# Patient Record
Sex: Female | Born: 1985 | Race: White | Hispanic: No | Marital: Married | Smoking: Never smoker
Health system: Southern US, Community
[De-identification: ages and names within clinical notes are randomized; demographics above are authoritative.]

## PROBLEM LIST (undated history)

## (undated) ENCOUNTER — Inpatient Hospital Stay (HOSPITAL_COMMUNITY): Payer: Self-pay

## (undated) DIAGNOSIS — W3400XA Accidental discharge from unspecified firearms or gun, initial encounter: Secondary | ICD-10-CM

---
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
## 2013-06-15 ENCOUNTER — Other Ambulatory Visit (HOSPITAL_COMMUNITY): Payer: Self-pay | Admitting: Orthopedic Surgery

## 2013-06-15 DIAGNOSIS — M47817 Spondylosis without myelopathy or radiculopathy, lumbosacral region: Secondary | ICD-10-CM

## 2013-06-22 ENCOUNTER — Ambulatory Visit (HOSPITAL_COMMUNITY)
Admission: RE | Admit: 2013-06-22 | Discharge: 2013-06-22 | Disposition: A | Discharge status: Home / Self Care | Payer: 59 | Source: Ambulatory Visit | Attending: Orthopedic Surgery | Admitting: Orthopedic Surgery

## 2013-06-22 DIAGNOSIS — M545 Low back pain, unspecified: Secondary | ICD-10-CM | POA: Insufficient documentation

## 2013-06-22 DIAGNOSIS — M79609 Pain in unspecified limb: Secondary | ICD-10-CM | POA: Insufficient documentation

## 2013-06-22 DIAGNOSIS — M47817 Spondylosis without myelopathy or radiculopathy, lumbosacral region: Secondary | ICD-10-CM | POA: Insufficient documentation

## 2014-08-22 ENCOUNTER — Other Ambulatory Visit (HOSPITAL_COMMUNITY): Payer: Self-pay | Admitting: *Deleted

## 2014-08-22 DIAGNOSIS — Z3490 Encounter for supervision of normal pregnancy, unspecified, unspecified trimester: Secondary | ICD-10-CM

## 2014-08-23 ENCOUNTER — Ambulatory Visit (HOSPITAL_COMMUNITY)
Admission: RE | Admit: 2014-08-23 | Discharge: 2014-08-23 | Disposition: A | Discharge status: Home / Self Care | Payer: 59 | Source: Ambulatory Visit | Attending: Nurse Practitioner | Admitting: Nurse Practitioner

## 2014-08-23 DIAGNOSIS — Z3A09 9 weeks gestation of pregnancy: Secondary | ICD-10-CM | POA: Insufficient documentation

## 2014-08-23 DIAGNOSIS — Z36 Encounter for antenatal screening of mother: Secondary | ICD-10-CM | POA: Insufficient documentation

## 2014-08-23 DIAGNOSIS — Z3490 Encounter for supervision of normal pregnancy, unspecified, unspecified trimester: Secondary | ICD-10-CM

## 2014-09-28 ENCOUNTER — Emergency Department (HOSPITAL_COMMUNITY)
Admission: EM | Admit: 2014-09-28 | Discharge: 2014-09-28 | Disposition: A | Discharge status: Home / Self Care | Payer: 59 | Attending: Emergency Medicine | Admitting: Emergency Medicine

## 2014-09-28 ENCOUNTER — Encounter (HOSPITAL_COMMUNITY): Payer: Self-pay | Admitting: *Deleted

## 2014-09-28 ENCOUNTER — Inpatient Hospital Stay (HOSPITAL_COMMUNITY)
Admission: AD | Admit: 2014-09-28 | Discharge: 2014-09-28 | Disposition: A | Discharge status: Home / Self Care | Payer: 59 | Source: Ambulatory Visit | Attending: Obstetrics & Gynecology | Admitting: Obstetrics & Gynecology

## 2014-09-28 DIAGNOSIS — Z3A13 13 weeks gestation of pregnancy: Secondary | ICD-10-CM

## 2014-09-28 DIAGNOSIS — J069 Acute upper respiratory infection, unspecified: Secondary | ICD-10-CM

## 2014-09-28 DIAGNOSIS — O9989 Other specified diseases and conditions complicating pregnancy, childbirth and the puerperium: Secondary | ICD-10-CM

## 2014-09-28 DIAGNOSIS — O99511 Diseases of the respiratory system complicating pregnancy, first trimester: Secondary | ICD-10-CM | POA: Diagnosis not present

## 2014-09-28 DIAGNOSIS — Z87828 Personal history of other (healed) physical injury and trauma: Secondary | ICD-10-CM | POA: Insufficient documentation

## 2014-09-28 DIAGNOSIS — Z3A14 14 weeks gestation of pregnancy: Secondary | ICD-10-CM | POA: Insufficient documentation

## 2014-09-28 HISTORY — DX: Accidental discharge from unspecified firearms or gun, initial encounter: W34.00XA

## 2014-09-28 LAB — POC URINE PREG, ED: Preg Test, Ur: POSITIVE — AB

## 2014-09-28 LAB — RAPID STREP SCREEN (MED CTR MEBANE ONLY): Streptococcus, Group A Screen (Direct): NEGATIVE

## 2014-09-28 MED ORDER — ACETAMINOPHEN 325 MG PO TABS
650.0000 mg | ORAL_TABLET | Freq: Once | ORAL | Status: AC
  Administered 2014-09-28: 650 mg via ORAL
  Filled 2014-09-28: qty 2

## 2014-09-28 MED ORDER — LORATADINE 10 MG PO TABS
10.0000 mg | ORAL_TABLET | Freq: Once | ORAL | Status: AC
  Administered 2014-09-28: 10 mg via ORAL
  Filled 2014-09-28: qty 1

## 2014-09-28 MED ORDER — LORATADINE 10 MG PO TABS: 10.0000 mg | ORAL_TABLET | Freq: Every day | ORAL | Status: AC

## 2014-09-28 NOTE — ED Notes (Signed)
Dr. Yelverton at the bedside.  

## 2014-09-28 NOTE — ED Notes (Signed)
Gave pt water, per RN 

## 2014-09-28 NOTE — ED Notes (Signed)
Pt arrives to ED c/o cough and shortness of breath. Non-productive cough. States she has taken robitussin every 4 hours with no relief. States she is [redacted] weeks pregnant.
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
## 2014-09-28 NOTE — Discharge Instructions (Signed)
Upper Respiratory Infection, Adult An upper respiratory infection (URI) is also sometimes known as the common cold. The upper respiratory tract includes the nose, sinuses, throat, trachea, and bronchi. Bronchi are the airways leading to the lungs. Most people improve within 1 week, but symptoms can last up to 2 weeks. A residual cough may last even longer.  CAUSES Many different viruses can infect the tissues lining the upper respiratory tract. The tissues become irritated and inflamed and often become very moist. Mucus production is also common. A cold is contagious. You can easily spread the virus to others by oral contact. This includes kissing, sharing a glass, coughing, or sneezing. Touching your mouth or nose and then touching a surface, which is then touched by another person, can also spread the virus. SYMPTOMS  Symptoms typically develop 1 to 3 days after you come in contact with a cold virus. Symptoms vary from person to person. They may include:  Runny nose.  Sneezing.  Nasal congestion.  Sinus irritation.  Sore throat.  Loss of voice (laryngitis).  Cough.  Fatigue.  Muscle aches.  Loss of appetite.  Headache.  Low-grade fever. DIAGNOSIS  You might diagnose your own cold based on familiar symptoms, since most people get a cold 2 to 3 times a year. Your caregiver can confirm this based on your exam. Most importantly, your caregiver can check that your symptoms are not due to another disease such as strep throat, sinusitis, pneumonia, asthma, or epiglottitis. Blood tests, throat tests, and X-rays are not necessary to diagnose a common cold, but they may sometimes be helpful in excluding other more serious diseases. Your caregiver will decide if any further tests are required. RISKS AND COMPLICATIONS  You may be at risk for a more severe case of the common cold if you smoke cigarettes, have chronic heart disease (such as heart failure) or lung disease (such as asthma), or if  you have a weakened immune system. The very young and very old are also at risk for more serious infections. Bacterial sinusitis, middle ear infections, and bacterial pneumonia can complicate the common cold. The common cold can worsen asthma and chronic obstructive pulmonary disease (COPD). Sometimes, these complications can require emergency medical care and may be life-threatening. PREVENTION  The best way to protect against getting a cold is to practice good hygiene. Avoid oral or hand contact with people with cold symptoms. Wash your hands often if contact occurs. There is no clear evidence that vitamin C, vitamin E, echinacea, or exercise reduces the chance of developing a cold. However, it is always recommended to get plenty of rest and practice good nutrition. TREATMENT  Treatment is directed at relieving symptoms. There is no cure. Antibiotics are not effective, because the infection is caused by a virus, not by bacteria. Treatment may include:  Increased fluid intake. Sports drinks offer valuable electrolytes, sugars, and fluids.  Breathing heated mist or steam (vaporizer or shower).  Eating chicken soup or other clear broths, and maintaining good nutrition.  Getting plenty of rest.  Using gargles or lozenges for comfort.  Controlling fevers with ibuprofen or acetaminophen as directed by your caregiver.  Increasing usage of your inhaler if you have asthma. Zinc gel and zinc lozenges, taken in the first 24 hours of the common cold, can shorten the duration and lessen the severity of symptoms. Pain medicines may help with fever, muscle aches, and throat pain. A variety of non-prescription medicines are available to treat congestion and runny nose. Your caregiver   can make recommendations and may suggest nasal or lung inhalers for other symptoms.  HOME CARE INSTRUCTIONS   Only take over-the-counter or prescription medicines for pain, discomfort, or fever as directed by your  caregiver.  Use a warm mist humidifier or inhale steam from a shower to increase air moisture. This may keep secretions moist and make it easier to breathe.  Drink enough water and fluids to keep your urine clear or pale yellow.  Rest as needed.  Return to work when your temperature has returned to normal or as your caregiver advises. You may need to stay home longer to avoid infecting others. You can also use a face mask and careful hand washing to prevent spread of the virus. SEEK MEDICAL CARE IF:   After the first few days, you feel you are getting worse rather than better.  You need your caregiver's advice about medicines to control symptoms.  You develop chills, worsening shortness of breath, or brown or red sputum. These may be signs of pneumonia.  You develop yellow or brown nasal discharge or pain in the face, especially when you bend forward. These may be signs of sinusitis.  You develop a fever, swollen neck glands, pain with swallowing, or white areas in the back of your throat. These may be signs of strep throat. SEEK IMMEDIATE MEDICAL CARE IF:   You have a fever.  You develop severe or persistent headache, ear pain, sinus pain, or chest pain.  You develop wheezing, a prolonged cough, cough up blood, or have a change in your usual mucus (if you have chronic lung disease).  You develop sore muscles or a stiff neck. Document Released: 01/07/2001 Document Revised: 10/06/2011 Document Reviewed: 10/19/2013 Exit[employer] Patient Information 2015 [employer]. This information is not intended to replace advice given to you by your health care provider. Make sure you discuss any questions you have with your health care provider.  

## 2014-09-28 NOTE — MAU Note (Signed)
[facility] urgent care this morning . Dx with the flu. FHR this morning was 120. Last HR was 160. She called her OB and was told to come and get FHR rechecked. No other complaints.

## 2014-10-01 LAB — CULTURE, GROUP A STREP: Strep A Culture: NEGATIVE

## 2015-08-03 ENCOUNTER — Encounter (HOSPITAL_COMMUNITY): Payer: Self-pay | Admitting: *Deleted

## 2016-02-08 IMAGING — US US OB COMP LESS 14 WK
1 series · 14 of 28 positions shown · non-contrast
Comparison: None.

CLINICAL DATA: Pregnancy with uncertain dates.  Unsure of LMP.

EXAM:
OBSTETRIC <14 WK ULTRASOUND
TECHNIQUE: Transabdominal ultrasound was performed for evaluation of the
gestation as well as the maternal uterus and adnexal regions.

[Series 1: us ob comp less 14 wk · 0.18mm/px · 14 of 37 slices shown]
[im 2/37]
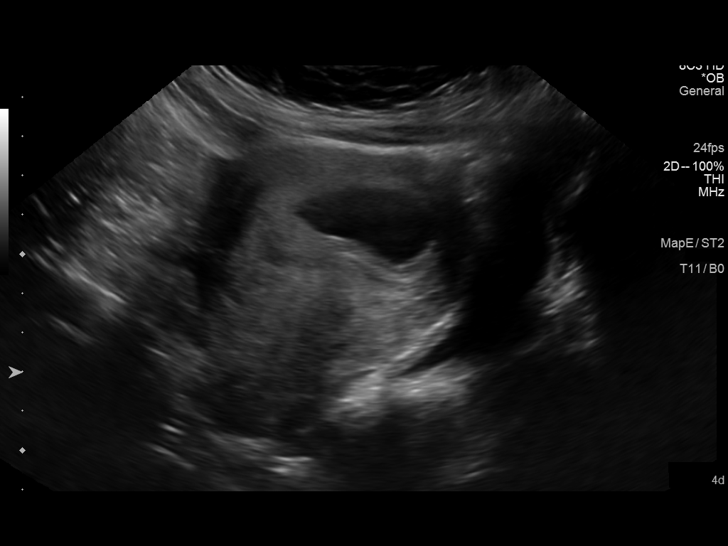
[im 5/37]
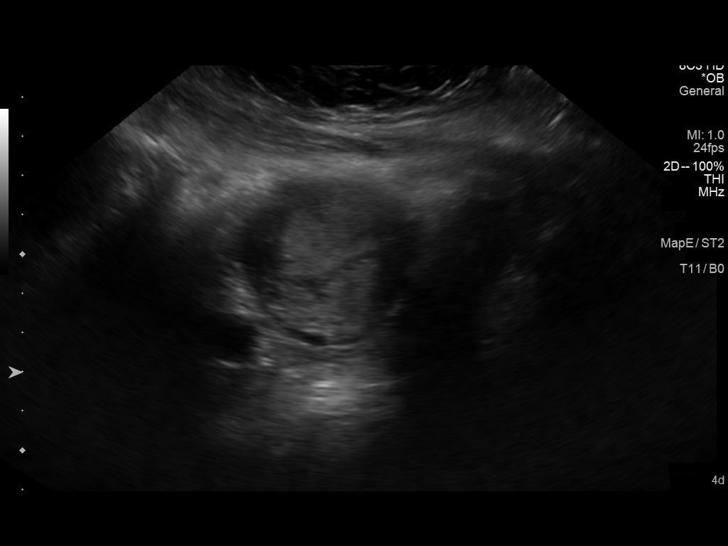
[im 7/37]
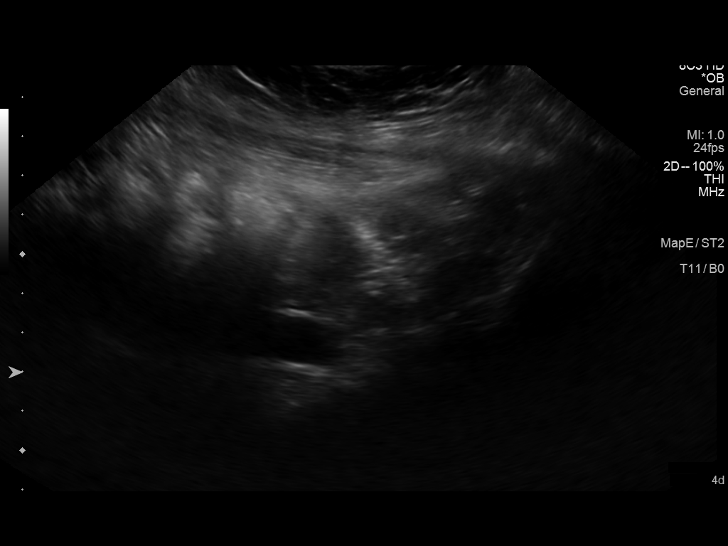
[im 10/37]
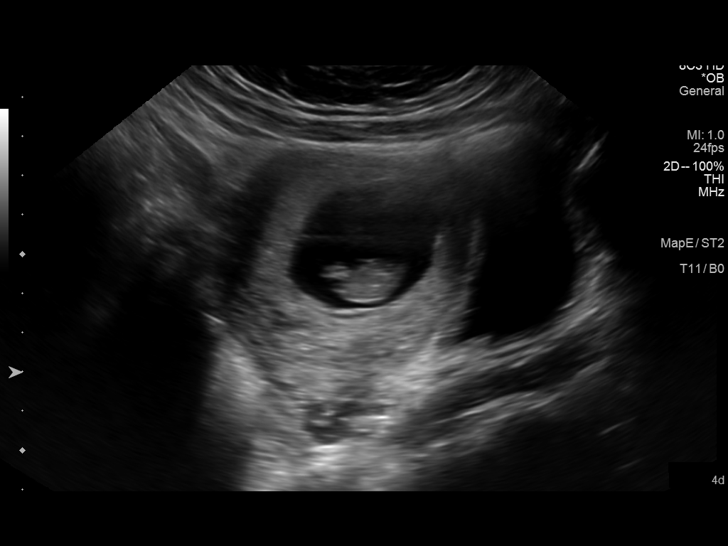
[im 13/37]
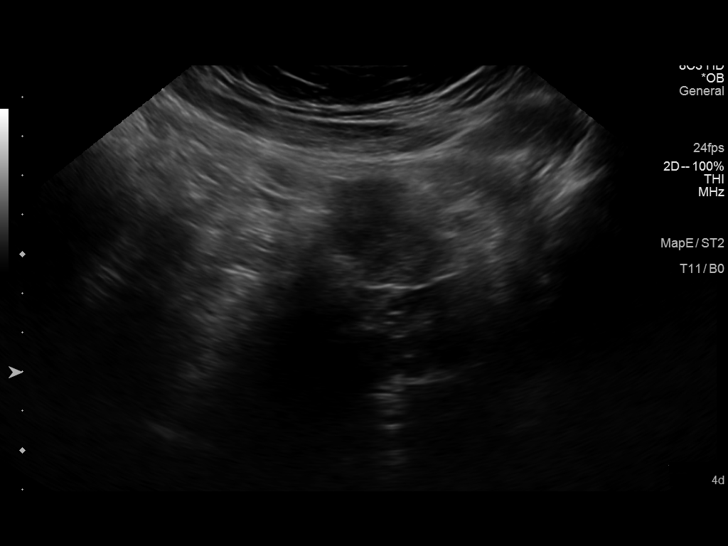
[im 15/37]
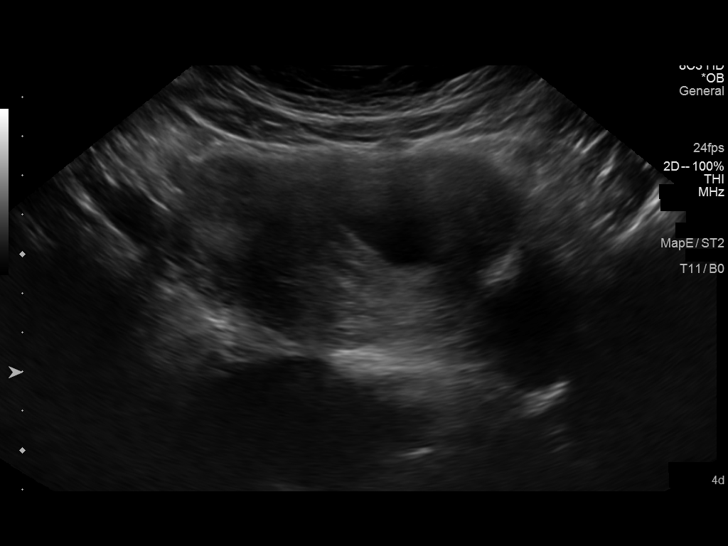
[im 18/37]
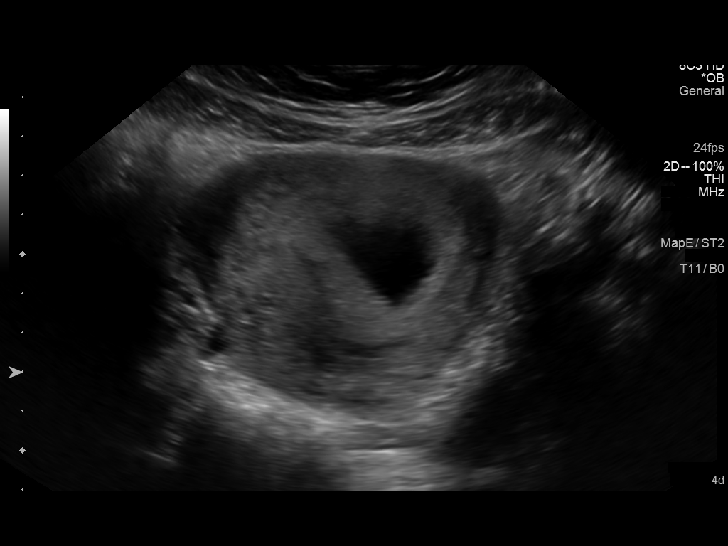
[im 21/37]
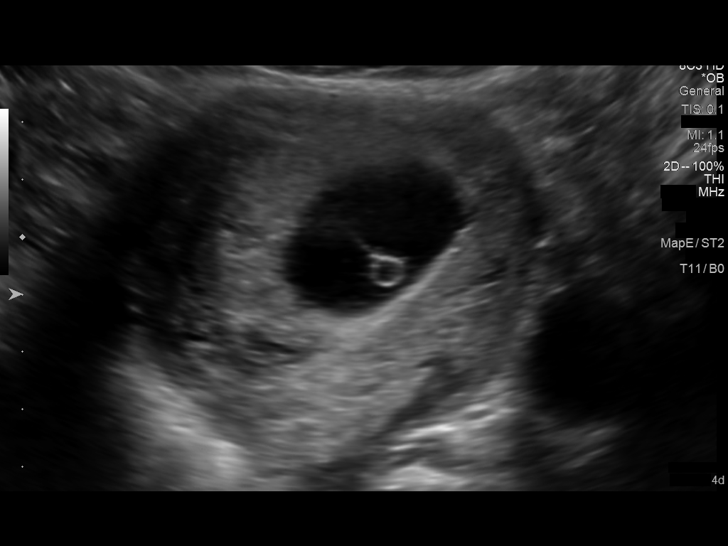
[im 23/37]
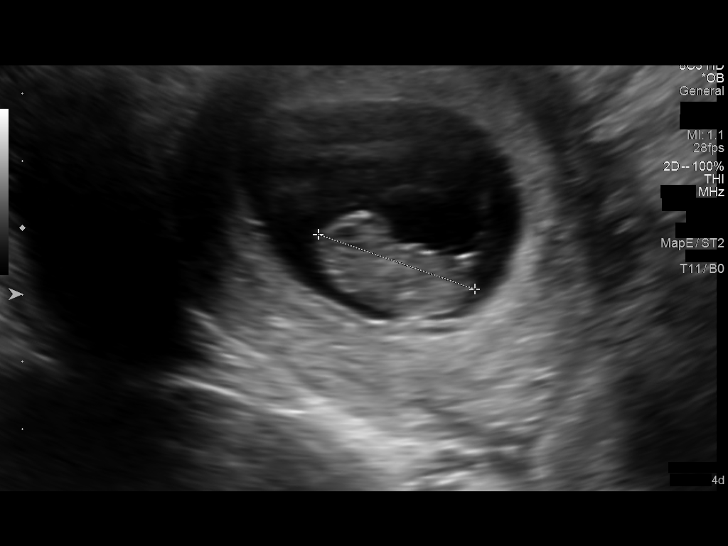
[im 26/37]
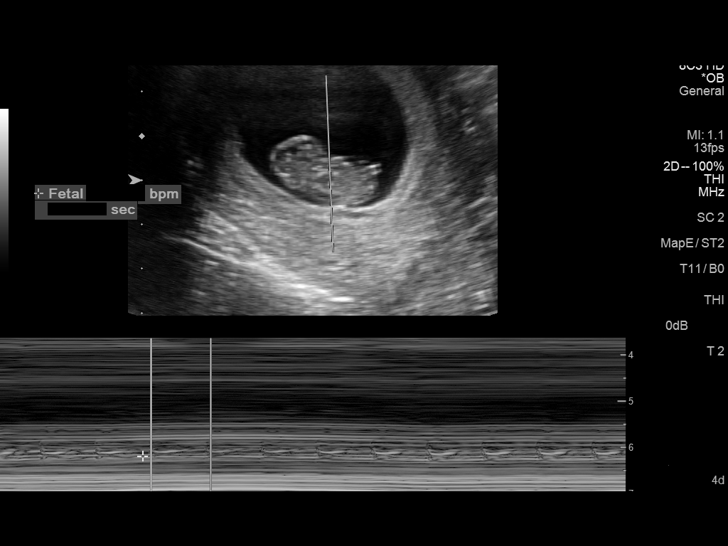
[im 29/37]
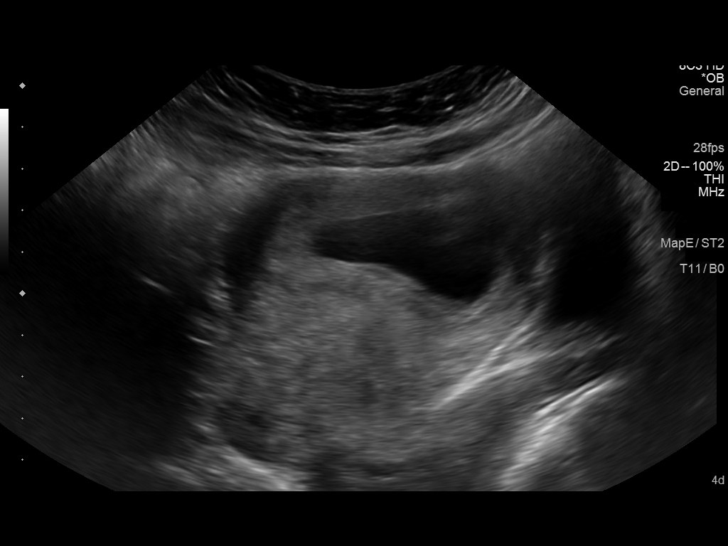
[im 31/37]
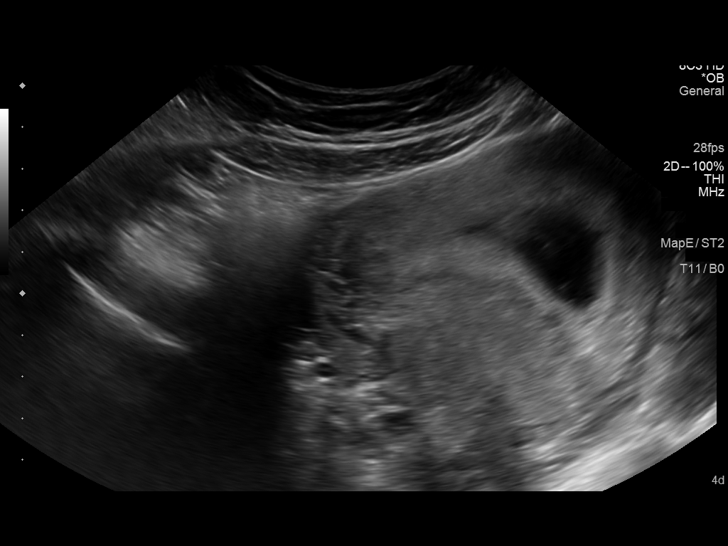
[im 34/37]
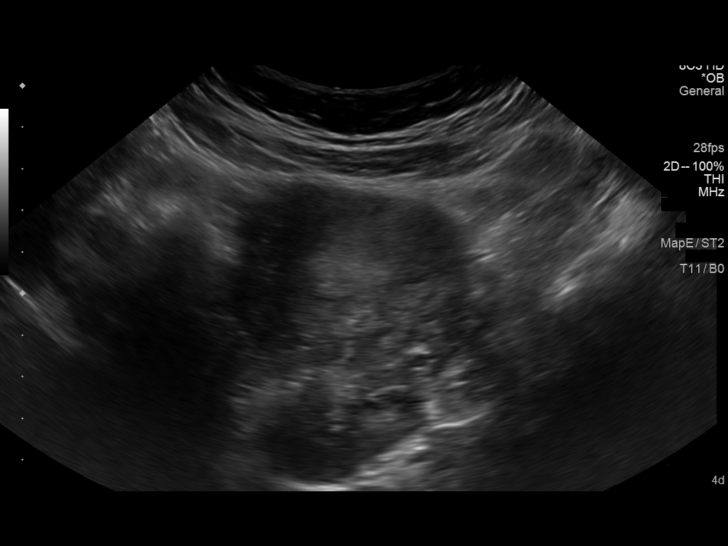
[im 37/37]
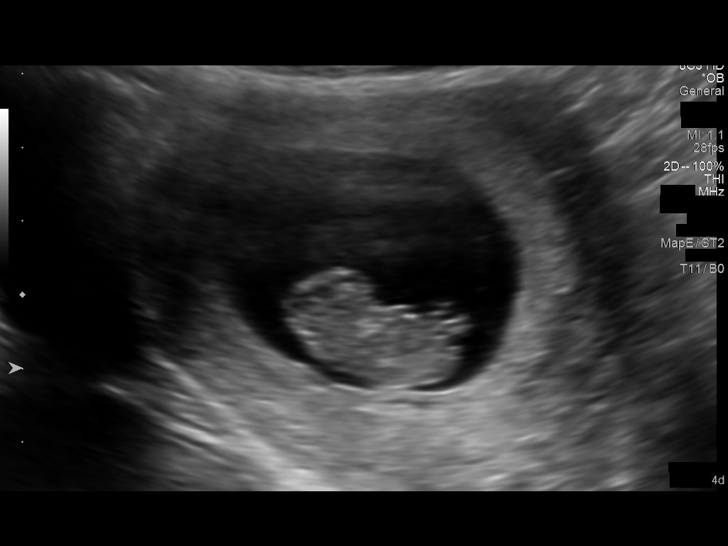

[14 of 28 positions shown; findings below may reference images not displayed]

FINDINGS: Intrauterine gestational sac: Visualized/normal in shape.

Yolk sac:  Visualized

Embryo:  Visualized

Cardiac Activity: Visualized

Heart Rate: 171 bpm

CRL:   25  mm   9 w 2 d                  US EDC: 03/26/2015

Maternal uterus/adnexae: Both ovaries are normal in appearance. No
mass or free fluid identified.
IMPRESSION: Single living IUP measuring 9 weeks 2 days with US EDC of
03/26/2015.

No significant maternal uterine or adnexal abnormality identified.

## 2018-04-12 ENCOUNTER — Ambulatory Visit (INDEPENDENT_AMBULATORY_CARE_PROVIDER_SITE_OTHER): Payer: BLUE CROSS/BLUE SHIELD | Admitting: Allergy and Immunology

## 2018-04-12 ENCOUNTER — Encounter: Payer: Self-pay | Admitting: Allergy and Immunology

## 2018-04-12 VITALS — BP 100/80 | HR 84 | Resp 16 | Ht 65.0 in | Wt 214.0 lb

## 2018-04-12 DIAGNOSIS — H1013 Acute atopic conjunctivitis, bilateral: Secondary | ICD-10-CM | POA: Diagnosis not present

## 2018-04-12 DIAGNOSIS — J3089 Other allergic rhinitis: Secondary | ICD-10-CM | POA: Diagnosis not present

## 2018-04-12 DIAGNOSIS — H1010 Acute atopic conjunctivitis, unspecified eye: Secondary | ICD-10-CM | POA: Insufficient documentation

## 2018-04-12 MED ORDER — LEVOCETIRIZINE DIHYDROCHLORIDE 5 MG PO TABS: 5.0000 mg | ORAL_TABLET | Freq: Every evening | ORAL | 5 refills | Status: AC

## 2018-04-12 MED ORDER — OLOPATADINE HCL 0.7 % OP SOLN: 1.0000 [drp] | OPHTHALMIC | 5 refills | Status: AC

## 2018-04-12 MED ORDER — FLUTICASONE PROPIONATE 93 MCG/ACT NA EXHU: 2.0000 | INHALANT_SUSPENSION | Freq: Two times a day (BID) | NASAL | 3 refills | Status: AC

## 2018-04-12 NOTE — Assessment & Plan Note (Signed)
   Aeroallergen avoidance measures have been discussed and provided in written form.  A prescription has been provided for levocetirizine, 5 mg daily as needed.  A prescription has been provided for Xhance, 2 actuations per nostril twice a day. Proper technique has been discussed and demonstrated.  Nasal saline spray (i.e., Simply Saline) or nasal saline lavage (i.e., NeilMed) is recommended as needed and prior to medicated nasal sprays.  The risks and benefits of aeroallergen immunotherapy have been discussed. The patient is motivated to initiate immunotherapy if insurance coverage is favorable. She will let us know how she would like to proceed. 
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
## 2018-04-12 NOTE — Assessment & Plan Note (Signed)
   Treatment plan as outlined above for allergic rhinitis.  A prescription has been provided for Pazeo, one drop per eye daily as needed.  I have also recommended eye lubricant drops (i.e., Natural Tears) as needed. 

## 2018-04-12 NOTE — Patient Instructions (Addendum)
Seasonal and perennial allergic rhinitis  Aeroallergen avoidance measures have been discussed and provided in written form.  A prescription has been provided for levocetirizine, 5 mg daily as needed.  A prescription has been provided for [facility], 2 actuations per nostril twice a day. Proper technique has been discussed and demonstrated.  Nasal saline spray (i.e., Simply Saline) or nasal saline lavage (i.e., NeilMed) is recommended as needed and prior to medicated nasal sprays.  The risks and benefits of aeroallergen immunotherapy have been discussed. The patient is motivated to initiate immunotherapy if insurance coverage is favorable. She will let us know how she would like to proceed.  Allergic conjunctivitis  Treatment plan as outlined above for allergic rhinitis.  A prescription has been provided for Pazeo, one drop per eye daily as needed.  I have also recommended eye lubricant drops (i.e., Natural Tears) as needed.   Return in about 4 months (around 08/12/2018), or if symptoms worsen or fail to improve.  Reducing Pollen Exposure  The American Academy of Allergy, Asthma and Immunology suggests the following steps to reduce your exposure to pollen during allergy seasons.    1. Do not hang sheets or clothing out to dry; pollen may collect on these items. 2. Do not mow lawns or spend time around freshly cut grass; mowing stirs up pollen. 3. Keep windows closed at night.  Keep car windows closed while driving. 4. Minimize morning activities outdoors, a time when pollen counts are usually at their highest. 5. Stay indoors as much as possible when pollen counts or humidity is high and on windy days when pollen tends to remain in the air longer. 6. Use air conditioning when possible.  Many air conditioners have filters that trap the pollen spores. 7. Use a HEPA room air filter to remove pollen form the indoor air you breathe.   Control of House Dust Mite Allergen  House dust mites  play a major role in allergic asthma and rhinitis.  They occur in environments with high humidity wherever human skin, the food for dust mites is found. High levels have been detected in dust obtained from mattresses, pillows, carpets, upholstered furniture, bed covers, clothes and soft toys.  The principal allergen of the house dust mite is found in its feces.  A gram of dust may contain 1,000 mites and 250,000 fecal particles.  Mite antigen is easily measured in the air during house cleaning activities.    1. Encase mattresses, including the box spring, and pillow, in an air tight cover.  Seal the zipper end of the encased mattresses with wide adhesive tape. 2. Wash the bedding in water of 130 degrees Farenheit weekly.  Avoid cotton comforters/quilts and flannel bedding: the most ideal bed covering is the dacron comforter. 3. Remove all upholstered furniture from the bedroom. 4. Remove carpets, carpet padding, rugs, and non-washable window drapes from the bedroom.  Wash drapes weekly or use plastic window coverings. 5. Remove all non-washable stuffed toys from the bedroom.  Wash stuffed toys weekly. 6. Have the room cleaned frequently with a vacuum cleaner and a damp dust-mop.  The patient should not be in a room which is being cleaned and should wait 1 hour after cleaning before going into the room. 7. Close and seal all heating outlets in the bedroom.  Otherwise, the room will become filled with dust-laden air.  An electric heater can be used to heat the room. 8. Reduce indoor humidity to less than 50%.  Do not use a humidifier.  Control of Dog or Cat Allergen  Avoidance is the best way to manage a dog or cat allergy. If you have a dog or cat and are allergic to dog or cats, consider removing the dog or cat from the home. If you have a dog or cat but don't want to find it a new home, or if your family wants a pet even though someone in the household is allergic, here are some strategies that may  help keep symptoms at bay:  1. Keep the pet out of your bedroom and restrict it to only a few rooms. Be advised that keeping the dog or cat in only one room will not limit the allergens to that room. 2. Don't pet, hug or kiss the dog or cat; if you do, wash your hands with soap and water. 3. High-efficiency particulate air (HEPA) cleaners run continuously in a bedroom or living room can reduce allergen levels over time. 4. Place electrostatic material sheet in the air inlet vent in the bedroom. 5. Regular use of a high-efficiency vacuum cleaner or a central vacuum can reduce allergen levels. 6. Giving your dog or cat a bath at least once a week can reduce airborne allergen.  Control of Cockroach Allergen  Cockroach allergen has been identified as an important cause of acute attacks of asthma, especially in urban settings.  There are fifty-five species of cockroach that exist in the [place], however only three, the [place], [place] and Oriental species produce allergen that can affect patients with Asthma.  Allergens can be obtained from fecal particles, egg casings and secretions from cockroaches.    1. Remove food sources. 2. Reduce access to water. 3. Seal access and entry points. 4. Spray runways with 0.5-1% Diazinon or Chlorpyrifos 5. Blow boric acid power under stoves and refrigerator. 6. Place bait stations (hydramethylnon) at feeding sites.

## 2018-04-13 ENCOUNTER — Telehealth: Payer: Self-pay

## 2018-04-13 NOTE — Telephone Encounter (Signed)
Called and left message for patient to call office in regards to follow up. Will need to schedule patient for a 4 month follow up.

## 2018-04-13 NOTE — Telephone Encounter (Signed)
-----   Message from Cristal Fordalph Carter Bobbitt, MD sent at 04/12/2018  5:06 PM EDT ----- I noticed on the patient's AVS that there was no follow up. I would like to see her back in 4 months, or sooner if needed. Please inform the patient that that was an error on the AVS and set her up for follow up. Thanks.

## 2018-04-15 NOTE — Telephone Encounter (Signed)
I spoke with patient and she has already scheduled a follow up and start injections appointment.

## 2018-04-21 ENCOUNTER — Encounter: Payer: Self-pay | Admitting: *Deleted

## 2018-04-21 DIAGNOSIS — J301 Allergic rhinitis due to pollen: Secondary | ICD-10-CM | POA: Diagnosis not present

## 2018-04-21 NOTE — Progress Notes (Signed)
VIALS MADE. EXP: 04-22-19. HV

## 2018-04-22 DIAGNOSIS — J3089 Other allergic rhinitis: Secondary | ICD-10-CM

## 2018-04-29 ENCOUNTER — Ambulatory Visit (INDEPENDENT_AMBULATORY_CARE_PROVIDER_SITE_OTHER): Payer: BLUE CROSS/BLUE SHIELD | Admitting: *Deleted

## 2018-04-29 DIAGNOSIS — J309 Allergic rhinitis, unspecified: Secondary | ICD-10-CM

## 2018-04-29 MED ORDER — EPINEPHRINE 0.3 MG/0.3ML IJ SOAJ: INTRAMUSCULAR | 2 refills | Status: AC

## 2018-04-29 NOTE — Progress Notes (Signed)
Immunotherapy   Patient Details  Name: Allison Hunter MRN: [id number] Date of Birth: 23-Oct-1985  04/29/2018  Allison Hunter started injections for  GRASS-WEED-TREE/DMITE-DOG-CR Following schedule: A  Frequency:2 times per week Epi-Pen:Prescription for Epi-Pen given Consent signed and patient instructions given. Patient waited in office 30 min without problems   Bennye Alm 04/29/2018, 6:01 PM

## 2018-05-05 ENCOUNTER — Ambulatory Visit (INDEPENDENT_AMBULATORY_CARE_PROVIDER_SITE_OTHER): Payer: BLUE CROSS/BLUE SHIELD | Admitting: *Deleted

## 2018-05-05 DIAGNOSIS — J309 Allergic rhinitis, unspecified: Secondary | ICD-10-CM

## 2018-05-07 ENCOUNTER — Ambulatory Visit (INDEPENDENT_AMBULATORY_CARE_PROVIDER_SITE_OTHER): Payer: BLUE CROSS/BLUE SHIELD

## 2018-05-07 DIAGNOSIS — J309 Allergic rhinitis, unspecified: Secondary | ICD-10-CM

## 2018-05-17 ENCOUNTER — Ambulatory Visit (INDEPENDENT_AMBULATORY_CARE_PROVIDER_SITE_OTHER): Payer: BLUE CROSS/BLUE SHIELD

## 2018-05-17 DIAGNOSIS — J309 Allergic rhinitis, unspecified: Secondary | ICD-10-CM

## 2018-05-20 ENCOUNTER — Ambulatory Visit (INDEPENDENT_AMBULATORY_CARE_PROVIDER_SITE_OTHER): Payer: BLUE CROSS/BLUE SHIELD

## 2018-05-20 DIAGNOSIS — J309 Allergic rhinitis, unspecified: Secondary | ICD-10-CM

## 2018-05-27 ENCOUNTER — Ambulatory Visit (INDEPENDENT_AMBULATORY_CARE_PROVIDER_SITE_OTHER): Payer: BLUE CROSS/BLUE SHIELD | Admitting: *Deleted

## 2018-05-27 DIAGNOSIS — J309 Allergic rhinitis, unspecified: Secondary | ICD-10-CM | POA: Diagnosis not present

## 2018-06-07 ENCOUNTER — Ambulatory Visit (INDEPENDENT_AMBULATORY_CARE_PROVIDER_SITE_OTHER): Payer: BLUE CROSS/BLUE SHIELD | Admitting: *Deleted

## 2018-06-07 DIAGNOSIS — J309 Allergic rhinitis, unspecified: Secondary | ICD-10-CM

## 2018-06-18 ENCOUNTER — Ambulatory Visit (INDEPENDENT_AMBULATORY_CARE_PROVIDER_SITE_OTHER): Payer: BLUE CROSS/BLUE SHIELD

## 2018-06-18 DIAGNOSIS — J309 Allergic rhinitis, unspecified: Secondary | ICD-10-CM | POA: Diagnosis not present

## 2018-06-21 ENCOUNTER — Ambulatory Visit (INDEPENDENT_AMBULATORY_CARE_PROVIDER_SITE_OTHER): Payer: BLUE CROSS/BLUE SHIELD | Admitting: *Deleted

## 2018-06-21 DIAGNOSIS — J309 Allergic rhinitis, unspecified: Secondary | ICD-10-CM | POA: Diagnosis not present

## 2018-07-02 ENCOUNTER — Ambulatory Visit (INDEPENDENT_AMBULATORY_CARE_PROVIDER_SITE_OTHER): Payer: BLUE CROSS/BLUE SHIELD | Admitting: *Deleted

## 2018-07-02 DIAGNOSIS — J309 Allergic rhinitis, unspecified: Secondary | ICD-10-CM | POA: Diagnosis not present

## 2018-07-09 ENCOUNTER — Ambulatory Visit (INDEPENDENT_AMBULATORY_CARE_PROVIDER_SITE_OTHER): Payer: BLUE CROSS/BLUE SHIELD | Admitting: *Deleted

## 2018-07-09 DIAGNOSIS — J309 Allergic rhinitis, unspecified: Secondary | ICD-10-CM

## 2018-07-14 ENCOUNTER — Ambulatory Visit (INDEPENDENT_AMBULATORY_CARE_PROVIDER_SITE_OTHER): Payer: BLUE CROSS/BLUE SHIELD | Admitting: *Deleted

## 2018-07-14 DIAGNOSIS — J309 Allergic rhinitis, unspecified: Secondary | ICD-10-CM | POA: Diagnosis not present

## 2018-07-27 ENCOUNTER — Ambulatory Visit (INDEPENDENT_AMBULATORY_CARE_PROVIDER_SITE_OTHER): Payer: BLUE CROSS/BLUE SHIELD | Admitting: *Deleted

## 2018-07-27 DIAGNOSIS — J309 Allergic rhinitis, unspecified: Secondary | ICD-10-CM

## 2018-08-09 ENCOUNTER — Encounter: Payer: Self-pay | Admitting: Allergy and Immunology

## 2018-08-09 ENCOUNTER — Ambulatory Visit: Payer: Self-pay | Admitting: *Deleted

## 2018-08-09 ENCOUNTER — Ambulatory Visit (INDEPENDENT_AMBULATORY_CARE_PROVIDER_SITE_OTHER): Payer: BLUE CROSS/BLUE SHIELD | Admitting: Allergy and Immunology

## 2018-08-09 DIAGNOSIS — H1013 Acute atopic conjunctivitis, bilateral: Secondary | ICD-10-CM

## 2018-08-09 DIAGNOSIS — J3089 Other allergic rhinitis: Secondary | ICD-10-CM | POA: Diagnosis not present

## 2018-08-09 DIAGNOSIS — J309 Allergic rhinitis, unspecified: Secondary | ICD-10-CM

## 2018-08-09 NOTE — Patient Instructions (Addendum)
Seasonal and perennial allergic rhinitis  Continue appropriate allergen avoidance measures, immunotherapy injections as prescribed, levocetirizine 5 mg daily as needed, and Xhance if needed.  Nasal saline spray (i.e. Simply Saline) is recommended prior to medicated nasal sprays and as needed.  Medications will be decreased or discontinued as symptom relief from immunotherapy becomes evident.  Allergic conjunctivitis  Treatment plan as outlined above and olopatadine eyedrops as needed.   Return in about 1 year (around 08/10/2019), or if symptoms worsen or fail to improve.

## 2018-08-09 NOTE — Assessment & Plan Note (Signed)
   Treatment plan as outlined above and olopatadine eyedrops as needed. 

## 2018-08-09 NOTE — Assessment & Plan Note (Signed)
   Continue appropriate allergen avoidance measures, immunotherapy injections as prescribed, levocetirizine 5 mg daily as needed, and Xhance if needed.  Nasal saline spray (i.e. Simply Saline) is recommended prior to medicated nasal sprays and as needed.  Medications will be decreased or discontinued as symptom relief from immunotherapy becomes evident.

## 2018-08-09 NOTE — Progress Notes (Signed)
Follow-up Note  RE: Allison Hunter MRN: [id number] DOB: 05/30/1986 Date of Office Visit: 08/09/2018  Primary care provider: Gordan PaymentGrisso, Greg A., MD Referring provider: Gordan PaymentGrisso, Greg A., MD  History of present illness: Allison Hunter is a 33 y.o. female with allergic rhinoconjunctivitis presented today for follow-up.  She was previously seen in this clinic for her initial evaluation on April 12, 2018.  She started Aeroallergen immunotherapy injections in early October 2019.  She has been receiving 1 or 2 rounds of immunotherapy per week without problems or complications..  She is currently requiring levocetirizine 5 mg daily to control her nasal and ocular symptoms.  Assessment and plan: Seasonal and perennial allergic rhinitis  Continue appropriate allergen avoidance measures, immunotherapy injections as prescribed, levocetirizine 5 mg daily as needed, and Xhance if needed.  Nasal saline spray (i.e. Simply Saline) is recommended prior to medicated nasal sprays and as needed.  Medications will be decreased or discontinued as symptom relief from immunotherapy becomes evident.  Allergic conjunctivitis  Treatment plan as outlined above and olopatadine eyedrops as needed.   Physical examination: Blood pressure 110/68, pulse 72, resp. rate 16, unknown if currently breastfeeding.  General: Alert, interactive, in no acute distress. HEENT: TMs pearly gray, turbinates minimally edematous without discharge, post-pharynx unremarkable. Neck: Supple without lymphadenopathy. Lungs: Clear to auscultation without wheezing, rhonchi or rales. CV: Normal S1, S2 without murmurs. Skin: Warm and dry, without lesions or rashes.  The following portions of the patient's history were reviewed and updated as appropriate: allergies, current medications, past family history, past medical history, past social history, past surgical history and problem list.  Allergies as of 08/09/2018      Reactions   Codeine  Itching, Nausea And Vomiting      Medication List       Accurate as of August 09, 2018 10:36 AM. Always use your most recent med list.        ALPRAZolam 0.25 MG tablet Commonly known as:  XANAX Take one-half to one by mouth 8 hours prn rescue/anxiety. Take one-half to one by mouth 8 hours prn rescue/anxiety.  Refills after prescription expiration require follow-up evaluation at the office.   amLODipine 2.5 MG tablet Commonly known as:  NORVASC Take by mouth.   cetirizine-pseudoephedrine 5-120 MG tablet Commonly known as:  ZYRTEC-D Take 1 tablet by mouth 2 (two) times daily.   EPINEPHrine 0.3 mg/0.3 mL Soaj injection Commonly known as:  AUVI-Q Use as directed for severe allergic reaction   Fiber 625 MG Tabs Take 1 capsule by mouth daily.   Fluticasone Propionate 93 MCG/ACT Exhu Commonly known as:  XHANCE Place 2 sprays into the nose 2 (two) times daily.   levocetirizine 5 MG tablet Commonly known as:  XYZAL Take 1 tablet (5 mg total) by mouth every evening.   loratadine 10 MG tablet Commonly known as:  CLARITIN Take 1 tablet (10 mg total) by mouth daily.   Norgestimate-Ethinyl Estradiol Triphasic 0.18/0.215/0.25 MG-35 MCG tablet   Olopatadine HCl 0.7 % Soln Commonly known as:  PAZEO Place 1 drop into both eyes 1 day or 1 dose.   sodium chloride 0.65 % Soln nasal spray Commonly known as:  OCEAN Place 1 spray into both nostrils as needed for congestion.   WOMENS MULTIVITAMIN PLUS Tabs Take 1 tablet by mouth daily.       Allergies  Allergen Reactions  . Codeine Itching and Nausea And Vomiting    I appreciate the opportunity to take part in Allison Hunter's care. Please  do not hesitate to contact me with questions.  Sincerely,   R. Jorene Guest, MD

## 2018-09-20 ENCOUNTER — Telehealth: Payer: Self-pay

## 2018-09-20 NOTE — Telephone Encounter (Signed)
Patient came in to get an injection today.  She was last in on 08/09/2018 and received 0.10 out of her gold vial.  Where should we restart her on injections?

## 2018-09-20 NOTE — Progress Notes (Signed)
This encounter was created in error - please disregard.

## 2018-09-21 ENCOUNTER — Encounter: Payer: Self-pay | Admitting: *Deleted

## 2018-09-21 ENCOUNTER — Ambulatory Visit (INDEPENDENT_AMBULATORY_CARE_PROVIDER_SITE_OTHER): Payer: BLUE CROSS/BLUE SHIELD | Admitting: *Deleted

## 2018-09-21 DIAGNOSIS — J309 Allergic rhinitis, unspecified: Secondary | ICD-10-CM | POA: Diagnosis not present

## 2018-09-21 NOTE — Telephone Encounter (Signed)
Go back to 0.025 of Gold. Thanks.

## 2018-09-21 NOTE — Telephone Encounter (Signed)
Noted and flagged in the flowsheet for the injection nurse.

## 2018-09-21 NOTE — Telephone Encounter (Signed)
LM for patient to return call to discuss

## 2018-09-21 NOTE — Progress Notes (Signed)
ERROR

## 2018-09-27 ENCOUNTER — Ambulatory Visit (INDEPENDENT_AMBULATORY_CARE_PROVIDER_SITE_OTHER): Payer: BLUE CROSS/BLUE SHIELD

## 2018-09-27 DIAGNOSIS — J309 Allergic rhinitis, unspecified: Secondary | ICD-10-CM | POA: Diagnosis not present

## 2018-09-27 NOTE — Telephone Encounter (Signed)
Patient has come in and restarted shots.

## 2019-01-11 ENCOUNTER — Ambulatory Visit: Payer: Self-pay | Admitting: *Deleted

## 2024-07-14 ENCOUNTER — Other Ambulatory Visit (HOSPITAL_COMMUNITY): Payer: Self-pay | Admitting: Family

## 2024-07-14 DIAGNOSIS — R790 Abnormal level of blood mineral: Secondary | ICD-10-CM | POA: Insufficient documentation

## 2024-07-14 DIAGNOSIS — D509 Iron deficiency anemia, unspecified: Secondary | ICD-10-CM | POA: Insufficient documentation

## 2024-07-18 ENCOUNTER — Encounter: Payer: Self-pay | Admitting: Family

## 2024-07-22 ENCOUNTER — Telehealth (HOSPITAL_COMMUNITY): Payer: Self-pay

## 2024-07-22 NOTE — Telephone Encounter (Signed)
 Auth Submission: APPROVED Site of care: Site of care: [facility] Payer: [employer] Medication & CPT/J Code(s) submitted: Venofer (Iron Sucrose) J1756 Diagnosis Code: D50.9, R79.0 Route of submission (phone, fax, portal): phone Phone #[phone]  Fax #[phone] (fax for clinicals) Auth type: Buy/Bill PB Units/visits requested: 300mg  x 3 doses Reference number: [id number] Approval from: 07/18/24 to 07/18/25    Approval letter has been scanned into patient's media tab.

## 2024-07-27 ENCOUNTER — Encounter: Payer: Self-pay | Admitting: Family

## 2024-08-03 ENCOUNTER — Ambulatory Visit

## 2024-08-03 VITALS — BP 135/85 | HR 59 | Temp 97.3°F | Resp 16 | Ht 65.0 in | Wt 210.4 lb

## 2024-08-03 DIAGNOSIS — D509 Iron deficiency anemia, unspecified: Secondary | ICD-10-CM

## 2024-08-03 DIAGNOSIS — R790 Abnormal level of blood mineral: Secondary | ICD-10-CM | POA: Diagnosis not present

## 2024-08-03 MED ORDER — IRON SUCROSE 300 MG IVPB - SIMPLE MED
300.0000 mg | Freq: Once | Status: AC
  Administered 2024-08-03: 300 mg via INTRAVENOUS
  Filled 2024-08-03: qty 265

## 2024-08-03 NOTE — Progress Notes (Signed)
 Diagnosis: , Acute Anemia  Provider:  Lonna Coder MD  Procedure: IV Infusion  IV Type: Peripheral, IV Location: R Antecubital  , Venofer  (Iron  Sucrose), Dose: 300 mg  Infusion Start Time: 1001  Infusion Stop Time: 1139  Post Infusion IV Care: Observation period completed and Peripheral IV Discontinued  Discharge: Condition: Good, Destination: Home . AVS Declined  Performed by:  Donny Childes, RN

## 2024-08-09 ENCOUNTER — Ambulatory Visit

## 2024-08-09 VITALS — BP 135/82 | HR 65 | Temp 98.2°F | Resp 16 | Ht 65.0 in | Wt 210.6 lb

## 2024-08-09 DIAGNOSIS — R790 Abnormal level of blood mineral: Secondary | ICD-10-CM | POA: Diagnosis not present

## 2024-08-09 DIAGNOSIS — D509 Iron deficiency anemia, unspecified: Secondary | ICD-10-CM | POA: Diagnosis not present

## 2024-08-09 MED ORDER — IRON SUCROSE 300 MG IVPB - SIMPLE MED
300.0000 mg | Freq: Once | Status: AC
  Administered 2024-08-09: 300 mg via INTRAVENOUS
  Filled 2024-08-09: qty 265

## 2024-08-09 NOTE — Progress Notes (Signed)
 Diagnosis:  Iron  Deficiency Anemia  Provider:  Praveen Mannam MD  Procedure: IV Infusion  IV Type: Peripheral, IV Location: L Forearm   Venofer  (Iron  Sucrose), Dose: 300 mg  Infusion Start Time: 0949  Infusion Stop Time: 1136  Post Infusion IV Care: Observation period completed and Peripheral IV Discontinued  Discharge: Condition: Good, Destination: Home . AVS Declined  Performed by:  Leita FORBES Miles, LPN

## 2024-08-10 ENCOUNTER — Ambulatory Visit

## 2024-08-17 ENCOUNTER — Ambulatory Visit

## 2024-08-17 VITALS — BP 122/71 | HR 67 | Temp 98.0°F | Resp 20 | Ht 65.0 in | Wt 214.2 lb

## 2024-08-17 DIAGNOSIS — D509 Iron deficiency anemia, unspecified: Secondary | ICD-10-CM

## 2024-08-17 DIAGNOSIS — R790 Abnormal level of blood mineral: Secondary | ICD-10-CM

## 2024-08-17 MED ORDER — IRON SUCROSE 300 MG IVPB - SIMPLE MED
300.0000 mg | Freq: Once | Status: AC
  Administered 2024-08-17: 300 mg via INTRAVENOUS
  Filled 2024-08-17: qty 265

## 2024-08-17 NOTE — Progress Notes (Signed)
 Diagnosis: Iron  Deficiency Anemia  Provider:  Mannam, Praveen MD  Procedure: IV Infusion  IV Type: Peripheral, IV Location: R Antecubital  Venofer  (Iron  Sucrose), Dose: 300 mg  Infusion Start Time: 0957  Infusion Stop Time: 1132  Post Infusion IV Care: Patient declined observation and Peripheral IV Discontinued  Discharge: Condition: Stable, Destination: Home . AVS Declined  Performed by:  Rocky FORBES Search, RN
